# Patient Record
Sex: Male | Born: 2000 | Race: White | Hispanic: Yes | Marital: Single | Smoking: Never smoker
Health system: Southern US, Community
[De-identification: ages and names within clinical notes are randomized; demographics above are authoritative.]

---
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
## 2019-07-25 ENCOUNTER — Emergency Department: Payer: Medicaid Other

## 2019-07-25 ENCOUNTER — Emergency Department
Admission: EM | Admit: 2019-07-25 | Discharge: 2019-07-25 | Disposition: A | Discharge status: Home / Self Care | Payer: Medicaid Other | Attending: Emergency Medicine | Admitting: Emergency Medicine

## 2019-07-25 ENCOUNTER — Other Ambulatory Visit: Payer: Self-pay

## 2019-07-25 DIAGNOSIS — R55 Syncope and collapse: Secondary | ICD-10-CM

## 2019-07-25 DIAGNOSIS — R519 Headache, unspecified: Secondary | ICD-10-CM | POA: Insufficient documentation

## 2019-07-25 LAB — CBC
HCT: 48.9 % (ref 39.0–52.0)
Hemoglobin: 17.5 g/dL — ABNORMAL HIGH (ref 13.0–17.0)
MCH: 31.6 pg (ref 26.0–34.0)
MCHC: 35.8 g/dL (ref 30.0–36.0)
MCV: 88.4 fL (ref 80.0–100.0)
Platelets: 136 10*3/uL — ABNORMAL LOW (ref 150–400)
RBC: 5.53 MIL/uL (ref 4.22–5.81)
RDW: 12.1 % (ref 11.5–15.5)
WBC: 9.2 10*3/uL (ref 4.0–10.5)
nRBC: 0 % (ref 0.0–0.2)

## 2019-07-25 LAB — URINALYSIS, COMPLETE (UACMP) WITH MICROSCOPIC
Bacteria, UA: NONE SEEN
Bilirubin Urine: NEGATIVE
Glucose, UA: NEGATIVE mg/dL
Hgb urine dipstick: NEGATIVE
Ketones, ur: NEGATIVE mg/dL
Leukocytes,Ua: NEGATIVE
Nitrite: NEGATIVE
Protein, ur: NEGATIVE mg/dL
Specific Gravity, Urine: 1.02 (ref 1.005–1.030)
Squamous Epithelial / LPF: NONE SEEN (ref 0–5)
WBC, UA: NONE SEEN WBC/hpf (ref 0–5)
pH: 6 (ref 5.0–8.0)

## 2019-07-25 LAB — BASIC METABOLIC PANEL
Anion gap: 14 (ref 5–15)
BUN: 18 mg/dL (ref 6–20)
CO2: 21 mmol/L — ABNORMAL LOW (ref 22–32)
Calcium: 9.4 mg/dL (ref 8.9–10.3)
Chloride: 103 mmol/L (ref 98–111)
Creatinine, Ser: 0.64 mg/dL (ref 0.61–1.24)
GFR calc Af Amer: >60 mL/min (ref >60)
GFR calc non Af Amer: >60 mL/min (ref >60)
Glucose, Bld: 99 mg/dL (ref 70–99)
Potassium: 3.6 mmol/L (ref 3.5–5.1)
Sodium: 138 mmol/L (ref 135–145)

## 2019-07-25 MED ORDER — KETOROLAC TROMETHAMINE 30 MG/ML IJ SOLN: 15.0000 mg | Freq: Once | INTRAMUSCULAR | Status: DC

## 2019-07-25 MED ORDER — SODIUM CHLORIDE 0.9% FLUSH: 3.0000 mL | Freq: Once | INTRAVENOUS | Status: DC

## 2019-07-25 NOTE — ED Provider Notes (Signed)
[facility] Emergency Department Provider Note  ____________________________________________   First MD Initiated Contact with Patient 07/25/19 [phone]     (approximate)  I have reviewed the triage vital signs and the nursing notes.   HISTORY  Chief Complaint Loss of Consciousness    HPI Angel Pham Angel Pham is a 19 y.o. male  With no chronic medical issues who presents for evaluation after passing out.  He says that he was asleep and then got up from bed and went and got a drink and then somehow passed out and he woke up in the hallway.  According to his girlfriend he said some new to her about getting another drink before he passed out.  When he first woke up he felt like he could not breathe but that went away by itself.  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  Nothing particular made it better or worse but is a little bit better now but still present.  He has had no numbness in any of his extremities.  He has may be had a little bit less than usual to eat or drink today.  He is not use drugs or alcohol and does not smoke.  He has not passed out before and does not have headaches regularly.  The onset of symptoms was acute earlier tonight.   He denies fever/chills, neck pain, visual changes, sore throat, chest pain, shortness of breath, cough, and he denies having any contact with known COVID-19 patients.        History reviewed. No pertinent past medical history.  There are no problems to display for this patient.   History reviewed. No pertinent surgical history.  Prior to Admission medications   Medication Sig Start Date End Date Taking? Authorizing Provider  ondansetron (ZOFRAN ODT) 4 MG disintegrating tablet Take 1 tablet (4 mg total) by mouth every 8  (eight) hours as needed for nausea or vomiting. 12/08/16   Rebecka Apley, MD    Allergies Patient has no known allergies.  History reviewed. No pertinent family history.  Social History Social History   Tobacco Use  . Smoking status: Never Smoker  . Smokeless tobacco: Never Used  Substance Use Topics  . Alcohol use: No  . Drug use: No    Review of Systems Constitutional: No fever/chills Eyes: No visual changes. ENT: No sore throat. Cardiovascular: Syncopal episode.  Denies chest pain. Respiratory: Denies shortness of breath. Gastrointestinal: No abdominal pain.  No nausea, no vomiting.  No diarrhea.  No constipation. Genitourinary: Negative for dysuria. Musculoskeletal: Negative for neck pain.  Negative for back pain. Integumentary: Negative for rash. Neurological: Generalized headache, no focal numbness nor weakness.   ____________________________________________   PHYSICAL EXAM:  VITAL SIGNS: ED Triage Vitals  Enc Vitals Group     BP 07/25/19 0021 137/87     Pulse Rate 07/25/19 0021 84     Resp 07/25/19 0021 18     Temp 07/25/19 0021 98 F (36.7 C)     Temp Source 07/25/19 0021 Oral     SpO2 07/25/19 0021 98 %     Weight 07/25/19 0022 93 kg (205 lb)     Height 07/25/19 0022 1.676 m (5\' 6" )     Head Circumference --  Peak Flow --      Pain Score 07/25/19 0021 7     Pain Loc --      Pain Edu? --      Excl. in GC? --     Constitutional: Alert and oriented.  No acute distress. Eyes: Conjunctivae are normal.  Pupils are equal and reactive bilaterally. Head: Atraumatic. Nose: No congestion/rhinnorhea. Mouth/Throat: Patient is wearing a mask. Neck: No stridor.  No meningeal signs.   Cardiovascular: Normal rate, regular rhythm. Good peripheral circulation. Grossly normal heart sounds. Respiratory: Normal respiratory effort.  No retractions. Gastrointestinal: Soft and nontender. No distention.  Musculoskeletal: No lower extremity tenderness nor edema.  No gross deformities of extremities. Neurologic:  Normal speech and language. No gross focal neurologic deficits are appreciated.  Skin:  Skin is warm, dry and intact. Psychiatric: Mood and affect are normal. Speech and behavior are normal.  ____________________________________________   LABS (all labs ordered are listed, but only abnormal results are displayed)  Labs Reviewed  BASIC METABOLIC PANEL - Abnormal; Notable for the following components:      Result Value   CO2 21 (*)    All other components within normal limits  CBC - Abnormal; Notable for the following components:   Hemoglobin 17.5 (*)    Platelets 136 (*)    All other components within normal limits  URINALYSIS, COMPLETE (UACMP) WITH MICROSCOPIC - Abnormal; Notable for the following components:   Color, Urine YELLOW (*)    APPearance CLEAR (*)    All other components within normal limits  CBG MONITORING, ED   ____________________________________________  EKG  ED ECG REPORT I, Loleta Rose, the attending physician, personally viewed and interpreted this ECG.  Date: 07/25/2019 EKG Time: 00: 23 Rate: 77 Rhythm: normal sinus rhythm QRS Axis: normal Intervals: normal ST/T Wave abnormalities: normal Narrative Interpretation: no evidence of acute ischemia  ____________________________________________  RADIOLOGY I, Loleta Rose, personally viewed and evaluated these images (plain radiographs) as part of my medical decision making, as well as reviewing the written report by the radiologist.  ED MD interpretation: Normal head CT  Official radiology report(s): CT HEAD WO CONTRAST  Result Date: 07/25/2019 CLINICAL DATA:  Headache, acute, normal neuro exam EXAM: CT HEAD WITHOUT CONTRAST TECHNIQUE: Contiguous axial images were obtained from the base of the skull through the vertex without intravenous contrast. COMPARISON:  None. FINDINGS: Brain: No intracranial hemorrhage, mass effect, or midline shift. No  hydrocephalus. The basilar cisterns are patent. No evidence of territorial infarct or acute ischemia. No extra-axial or intracranial fluid collection. Vascular: Hyperdense vessel. Skull: Normal. Negative for fracture or focal lesion. Sinuses/Orbits: Paranasal sinuses and mastoid air cells are clear. The visualized orbits are unremarkable. Other: None. IMPRESSION: Negative head CT. Electronically Signed   By: Narda Rutherford M.D.   On: 07/25/2019 00:44    ____________________________________________   PROCEDURES   Procedure(s) performed (including Critical Care):  Procedures   ____________________________________________   INITIAL IMPRESSION / MDM / ASSESSMENT AND PLAN / ED COURSE  As part of my medical decision making, I reviewed the following data within the electronic MEDICAL RECORD NUMBER Nursing notes reviewed and incorporated, Labs reviewed , EKG interpreted , Old chart reviewed, Notes from prior ED visits and [place] Controlled Substance Database   Differential diagnosis includes, but is not limited to, nonspecific syncope, cardiogenic syncope, medication or drug side effect, acute infection, intracranial bleeding.  The patient is well-appearing and in no distress even though he does have a persistent headache.  He has  no focal neurological deficits, stable vital signs, normal head CT, normal basic metabolic panel, urinalysis, and CBC.  Medication further evaluation.  No indication for admission based on [place] syncope rules.  The patient is comfortable with the plan for discharge home.  I am giving him a dose of Toradol 15 mg IV for his headache and he is tolerating water without difficulty.  I gave my usual customary return precautions and he understands and agrees with the plan.          ____________________________________________  FINAL CLINICAL IMPRESSION(S) / ED DIAGNOSES  Final diagnoses:  Syncope and collapse  Acute intractable headache, unspecified headache type      MEDICATIONS GIVEN DURING THIS VISIT:  Medications  ketorolac (TORADOL) 30 MG/ML injection 15 mg (has no administration in time range)     ED Discharge Orders    None      *Please note:  Carole Deere was evaluated in Emergency Department on 07/25/2019 for the symptoms described in the history of present illness. He was evaluated in the context of the global COVID-19 pandemic, which necessitated consideration that the patient might be at risk for infection with the SARS-CoV-2 virus that causes COVID-19. Institutional protocols and algorithms that pertain to the evaluation of patients at risk for COVID-19 are in a state of rapid change based on information released by regulatory bodies including the CDC and federal and state organizations. These policies and algorithms were followed during the patient's care in the ED.  Some ED evaluations and interventions may be delayed as a result of limited staffing during the pandemic.*  Note:  This document was prepared using Dragon voice recognition software and may include unintentional dictation errors.   Hinda Kehr, MD 07/25/19 [phone]

## 2019-07-25 NOTE — Discharge Instructions (Addendum)
You have been seen today in the Emergency Department (ED)  for syncope (passing out).  Your workup including labs and EKG show reassuring results.  Your symptoms may be due to mild dehydration, so it is important that you drink plenty of non-alcoholic fluids. ° °Please call your regular doctor as soon as possible to schedule the next available clinic appointment to follow up with him/her regarding your visit to the ED and your symptoms.  Return to the Emergency Department (ED)  if you have any further syncopal episodes (pass out again) or develop ANY chest pain, pressure, tightness, trouble breathing, sudden sweating, or other symptoms that concern you. ° °

## 2019-07-25 NOTE — ED Triage Notes (Signed)
Pt states he remembers going to bed and trying to sleep with a headache but then woke up in the floor in the hallway. Per girlfriend pt fell to the floor. Pt states that when he woke up he felt like hea cant breathe. Pt reports an intense headache now.

## 2019-07-25 NOTE — ED Notes (Signed)
Moms telephone number [phone] Byrd Hesselbach).

## 2020-11-14 IMAGING — CT CT HEAD W/O CM
3 series · 16 of 47 positions shown, 19 images · non-contrast
Comparison: None.

CLINICAL DATA: Headache, acute, normal neuro exam

EXAM:
CT HEAD WITHOUT CONTRAST
TECHNIQUE: Contiguous axial images were obtained from the base of the skull
through the vertex without intravenous contrast.

[Series 3: head wo · axial · 0.41mm/px · z∈[-155,-25]mm · 10 of 32 slices shown, 13 images]
[im 3/32  brain]
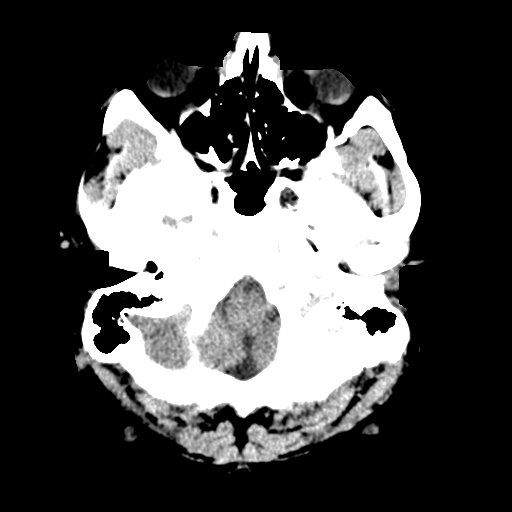
[im 3/32  bone]
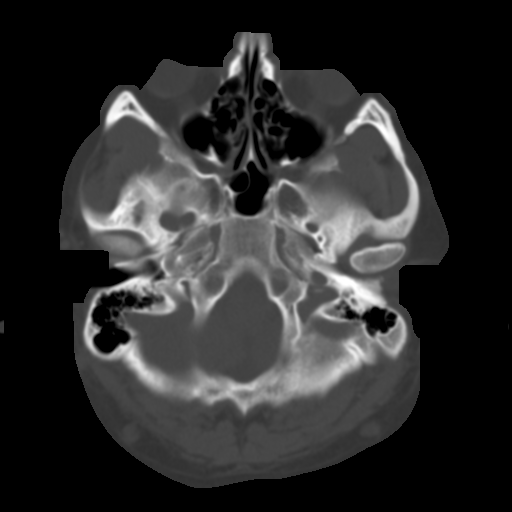
[im 6/32  brain]
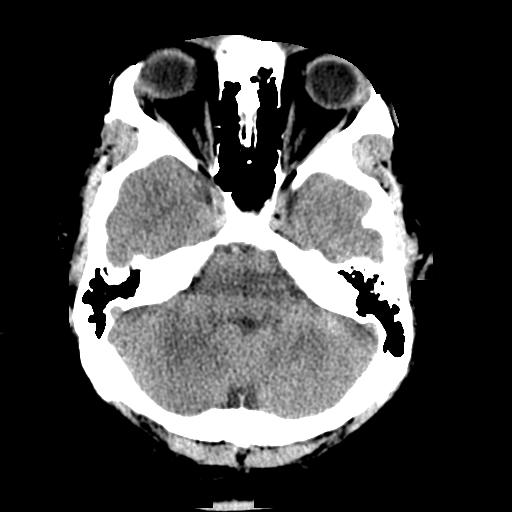
[im 9/32  brain]
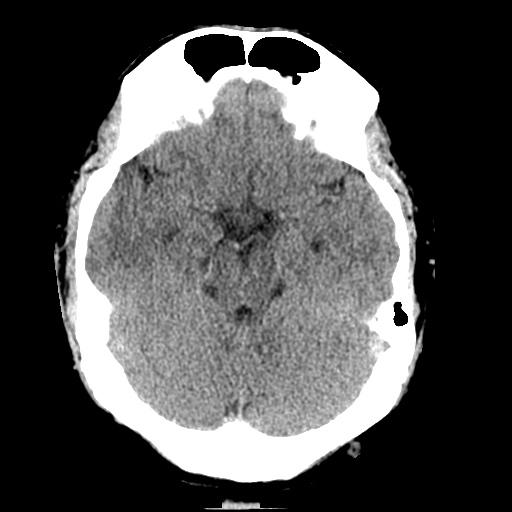
[im 11/32  brain]
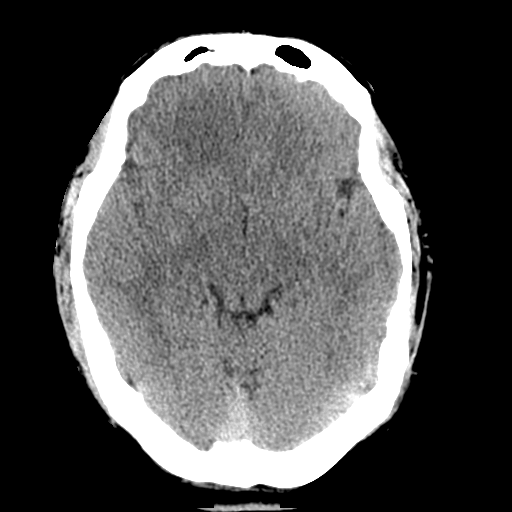
[im 14/32  brain]
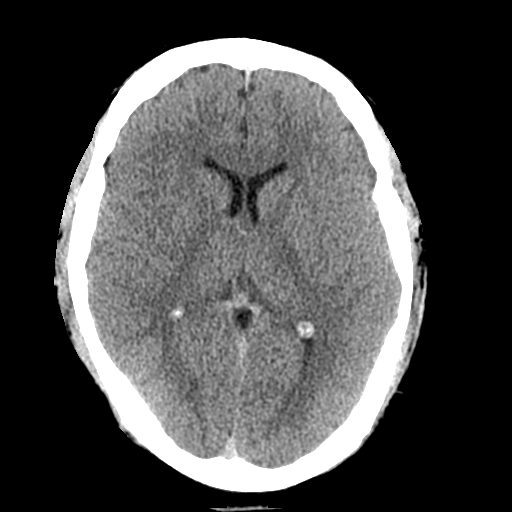
[im 14/32  bone]
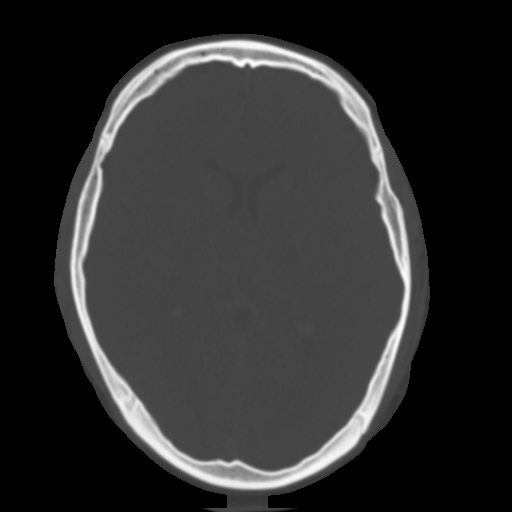
[im 18/32  brain]
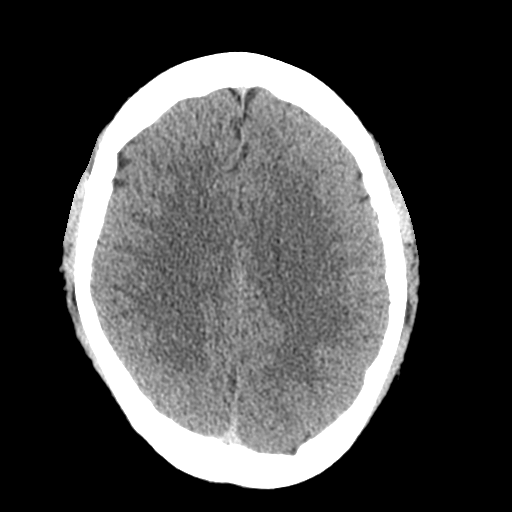
[im 21/32  brain]
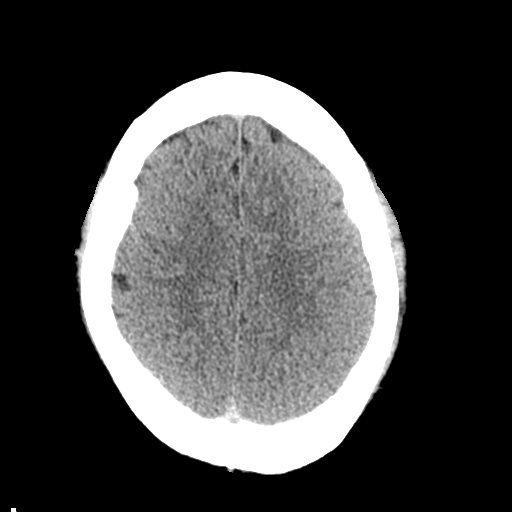
[im 24/32  brain]
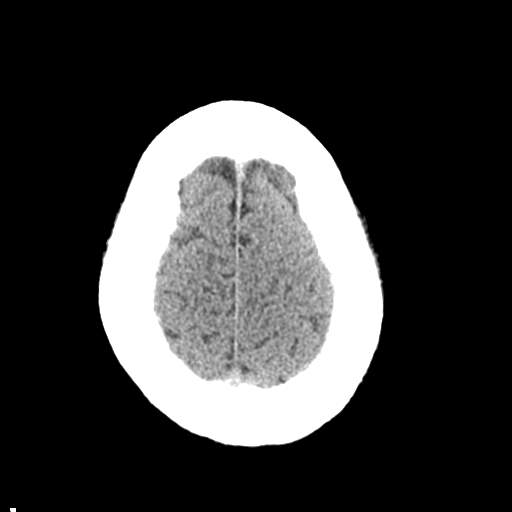
[im 26/32  brain]
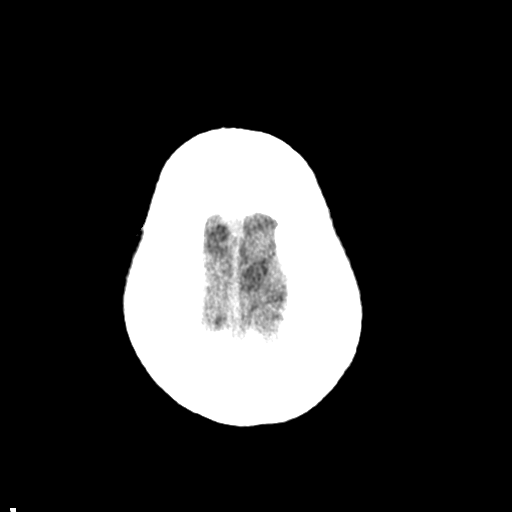
[im 26/32  bone]
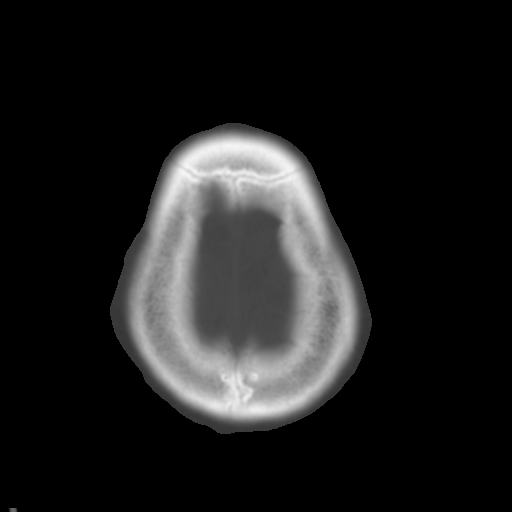
[im 29/32  brain]
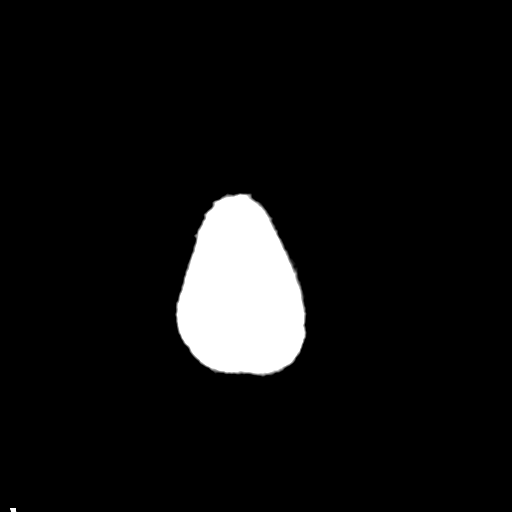

[Series 4: coronal soft tissue · coronal · 0.30mm/px · 3 of 66 slices shown]
[im 22/66  brain]
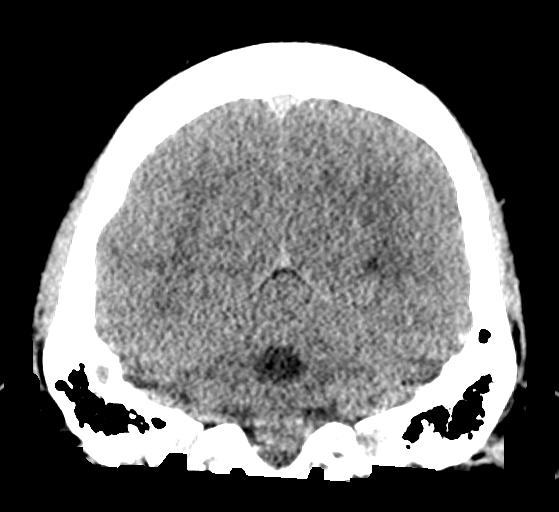
[im 29/66  brain]
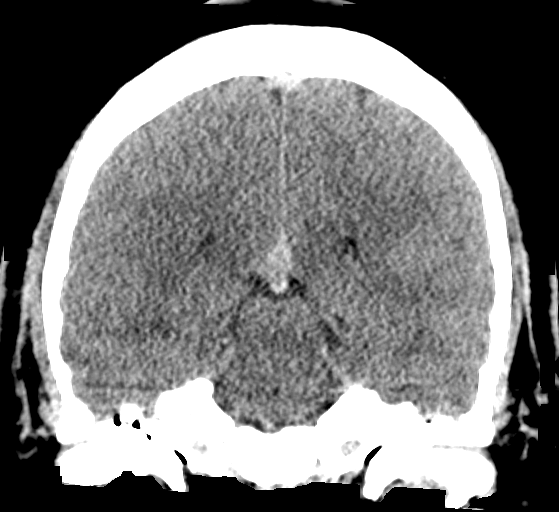
[im 37/66  brain]
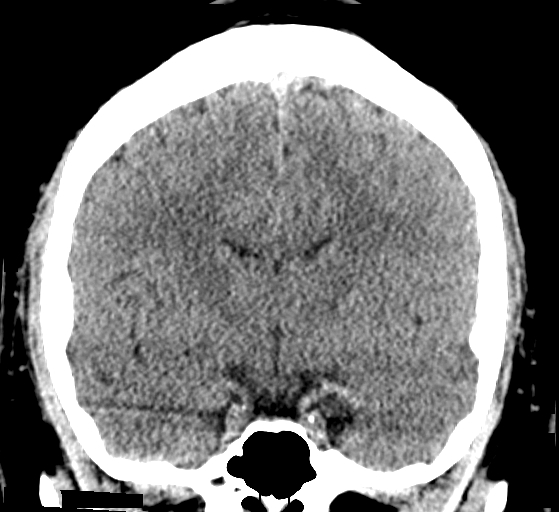

[Series 5: sagittal soft tissue · sagittal · 0.30mm/px · 3 of 57 slices shown]
[im 19/57  brain]
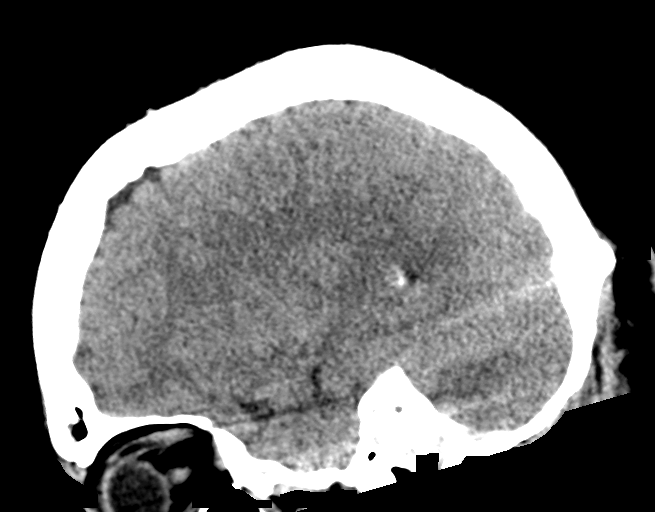
[im 29/57  brain]
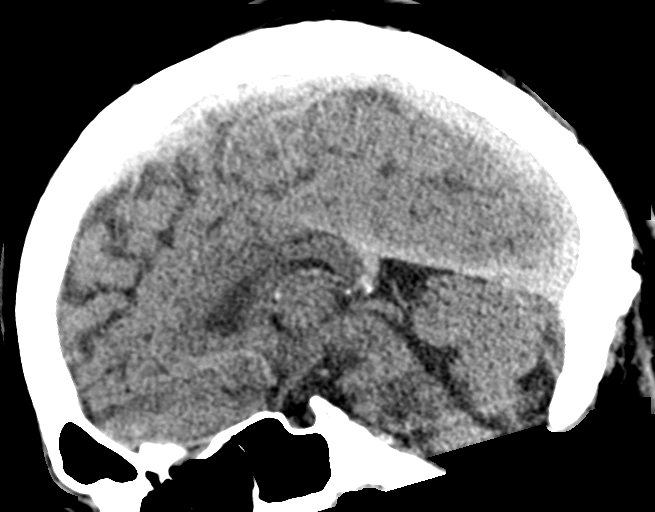
[im 38/57  brain]
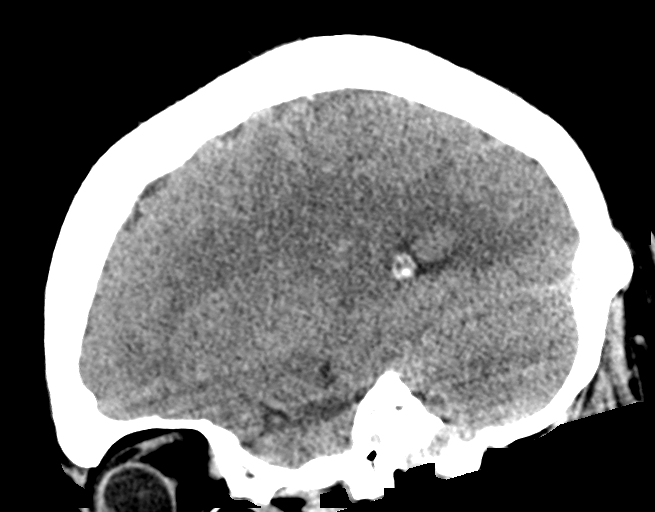

[16 of 47 positions shown; findings below may reference images not displayed]

FINDINGS: Brain: No intracranial hemorrhage, mass effect, or midline shift. No
hydrocephalus. The basilar cisterns are patent. No evidence of
territorial infarct or acute ischemia. No extra-axial or
intracranial fluid collection.

Vascular: Hyperdense vessel.

Skull: Normal. Negative for fracture or focal lesion.

Sinuses/Orbits: Paranasal sinuses and mastoid air cells are clear.
The visualized orbits are unremarkable.

Other: None.
IMPRESSION: Negative head CT.
